# Patient Record
Sex: Male | Born: 1945 | Race: White | Hispanic: No | Marital: Married | Smoking: Never smoker
Health system: Southern US, Community
[De-identification: ages and names within clinical notes are randomized; demographics above are authoritative.]

## PROBLEM LIST (undated history)

## (undated) DIAGNOSIS — I10 Essential (primary) hypertension: Secondary | ICD-10-CM

---

## 2018-08-05 ENCOUNTER — Emergency Department (HOSPITAL_COMMUNITY): Payer: Medicare Other

## 2018-08-05 ENCOUNTER — Emergency Department (HOSPITAL_COMMUNITY)
Admission: EM | Admit: 2018-08-05 | Discharge: 2018-08-06 | Disposition: A | Discharge status: Home / Self Care | Payer: Medicare Other | Attending: Emergency Medicine | Admitting: Emergency Medicine

## 2018-08-05 ENCOUNTER — Other Ambulatory Visit: Payer: Self-pay

## 2018-08-05 ENCOUNTER — Encounter (HOSPITAL_COMMUNITY): Payer: Self-pay | Admitting: *Deleted

## 2018-08-05 DIAGNOSIS — Z79899 Other long term (current) drug therapy: Secondary | ICD-10-CM | POA: Insufficient documentation

## 2018-08-05 DIAGNOSIS — F13239 Sedative, hypnotic or anxiolytic dependence with withdrawal, unspecified: Secondary | ICD-10-CM | POA: Diagnosis not present

## 2018-08-05 DIAGNOSIS — I10 Essential (primary) hypertension: Secondary | ICD-10-CM | POA: Diagnosis not present

## 2018-08-05 DIAGNOSIS — F13939 Sedative, hypnotic or anxiolytic use, unspecified with withdrawal, unspecified: Secondary | ICD-10-CM

## 2018-08-05 DIAGNOSIS — R531 Weakness: Secondary | ICD-10-CM | POA: Diagnosis present

## 2018-08-05 HISTORY — DX: Essential (primary) hypertension: I10

## 2018-08-05 LAB — BASIC METABOLIC PANEL
Anion gap: 12 (ref 5–15)
BUN: 15 mg/dL (ref 8–23)
CALCIUM: 9.1 mg/dL (ref 8.9–10.3)
CO2: 23 mmol/L (ref 22–32)
CREATININE: 1.06 mg/dL (ref 0.61–1.24)
Chloride: 108 mmol/L (ref 98–111)
GFR calc Af Amer: >60 mL/min (ref >60)
GLUCOSE: 113 mg/dL — AB (ref 70–99)
Potassium: 3.7 mmol/L (ref 3.5–5.1)
Sodium: 143 mmol/L (ref 135–145)

## 2018-08-05 LAB — CBC
HCT: 47 % (ref 39.0–52.0)
HEMOGLOBIN: 16 g/dL (ref 13.0–17.0)
MCH: 30.4 pg (ref 26.0–34.0)
MCHC: 34 g/dL (ref 30.0–36.0)
MCV: 89.4 fL (ref 78.0–100.0)
PLATELETS: 153 10*3/uL (ref 150–400)
RBC: 5.26 MIL/uL (ref 4.22–5.81)
RDW: 12.5 % (ref 11.5–15.5)
WBC: 6.8 10*3/uL (ref 4.0–10.5)

## 2018-08-05 LAB — CBG MONITORING, ED: GLUCOSE-CAPILLARY: 111 mg/dL — AB (ref 70–99)

## 2018-08-05 LAB — TROPONIN I

## 2018-08-05 MED ORDER — ALPRAZOLAM 0.25 MG PO TABS
0.5000 mg | ORAL_TABLET | Freq: Once | ORAL | Status: AC
  Administered 2018-08-06: 0.5 mg via ORAL
  Filled 2018-08-05: qty 2

## 2018-08-05 MED ORDER — ATENOLOL 25 MG PO TABS
25.0000 mg | ORAL_TABLET | Freq: Once | ORAL | Status: AC
  Administered 2018-08-06: 25 mg via ORAL
  Filled 2018-08-05: qty 1

## 2018-08-05 MED ORDER — GABAPENTIN 300 MG PO CAPS: 300.0000 mg | ORAL_CAPSULE | Freq: Once | ORAL | Status: DC

## 2018-08-05 MED ORDER — GABAPENTIN 300 MG PO CAPS
600.0000 mg | ORAL_CAPSULE | Freq: Once | ORAL | Status: AC
  Administered 2018-08-06: 600 mg via ORAL
  Filled 2018-08-05: qty 2

## 2018-08-05 NOTE — ED Triage Notes (Signed)
The pt feels weird he left his meds in [place] and he goes back there tomorrow  He has had cold sweats  No pain

## 2018-08-05 NOTE — ED Notes (Signed)
Pt reports traveling from [place] and leaving all of his medications at home, not having any since Tuesday. Pt states he drove

## 2018-08-06 MED ORDER — GABAPENTIN 300 MG PO CAPS: 600.0000 mg | ORAL_CAPSULE | Freq: Two times a day (BID) | ORAL | 0 refills | Status: AC

## 2018-08-06 MED ORDER — ATENOLOL 25 MG PO TABS: 25.0000 mg | ORAL_TABLET | Freq: Every day | ORAL | 0 refills | Status: AC

## 2018-08-06 NOTE — ED Provider Notes (Signed)
[facility] EMERGENCY [facility] Provider Note   CSN: [id number] Arrival date & time: 08/05/18  2204     History   Chief Complaint Chief Complaint  Patient presents with  . Weakness    HPI Raymond Glover is a 72 y.o. male.  The history is provided by the patient.  Weakness  This is a new problem. The current episode started 1 to 2 hours ago. The problem has not changed since onset.There was no focality noted. There has been no fever. Pertinent negatives include no shortness of breath, no chest pain, no vomiting, no altered mental status, no confusion and no headaches. There were no medications administered prior to arrival.   Pt with History of hypertension anxiety presents with weakness and not feeling well. Patient is here in [place] on business, he lives in [place]. He left his medications at home.  He usually takes gabapentin 600 mg and Xanax 0.5 mg at nighttime.  He is also missing his daily dose of atenolol. Tonight after trying to go to sleep, he woke up feeling anxious, generalized weakness, he reports he "felt weird" he reports cold sweats. Denies headache/vision changes.  No chest pain or shortness of breath No focal weakness.  Past Medical History:  Diagnosis Date  . Hypertension     There are no active problems to display for this patient.   History reviewed. No pertinent surgical history.      Home Medications    Prior to Admission medications   Medication Sig Start Date End Date Taking? Authorizing Provider  ALPRAZolam Prudy Feeler(XANAX) 0.5 MG tablet Take 0.5 mg by mouth at bedtime.  07/19/18  Yes [provider]    Family History No family history on file.  Social History Social History   Tobacco Use  . Smoking status: Never Smoker  . Smokeless tobacco: Never Used  Substance Use Topics  . Alcohol use: Yes  . Drug use: Not on file     Allergies   Nitroglycerin; Percocet [oxycodone-acetaminophen]; and  Sulfa antibiotics   Review of Systems Review of Systems  Constitutional: Positive for chills and fatigue. Negative for fever.  Respiratory: Negative for shortness of breath.   Cardiovascular: Negative for chest pain.  Gastrointestinal: Negative for vomiting.  Neurological: Positive for weakness. Negative for syncope and headaches.  Psychiatric/Behavioral: Negative for confusion.  All other systems reviewed and are negative.    Physical Exam Updated Vital Signs BP (!) 159/117   Pulse 74   Temp 97.9 F (36.6 C)   Resp 20   Ht 1.854 m (6\' 1" )   Wt 97.1 kg   SpO2 98%   BMI 28.23 kg/m   Physical Exam CONSTITUTIONAL: Well developed/well nourished HEAD: Normocephalic/atraumatic EYES: EOMI/PERRL, no nystagmus, no ptosis ENMT: Mucous membranes moist NECK: supple no meningeal signs, no bruits CV: S1/S2 noted, no murmurs/rubs/gallops noted LUNGS: Lungs are clear to auscultation bilaterally, no apparent distress ABDOMEN: soft, nontender, no rebound or guarding GU:no cva tenderness NEURO:Awake/alert, face symmetric, no arm or leg drift is noted Equal 5/5 strength with shoulder abduction, elbow flex/extension, wrist flex/extension in upper extremities and equal hand grips bilaterally Equal 5/5 strength with hip flexion,knee flex/extension, foot dorsi/plantar flexion Cranial nerves 3/4/5/6/06/30/09/11/12 tested and intact Gait normal without ataxia No past pointing Sensation to light touch intact in all extremities EXTREMITIES: pulses normal, full ROM SKIN: warm, color normal PSYCH: no abnormalities of mood noted   ED Treatments / Results  Labs (all labs ordered are listed, but only  abnormal results are displayed) Labs Reviewed  BASIC METABOLIC PANEL - Abnormal; Notable for the following components:      Result Value   Glucose, Bld 113 (*)    All other components within normal limits  CBG MONITORING, ED - Abnormal; Notable for the following components:   Glucose-Capillary  111 (*)    All other components within normal limits  CBC  TROPONIN I    EKG EKG Interpretation  Date/Time:  Wednesday August 05 2018 23:39:29 EDT Ventricular Rate:  69 PR Interval:  168 QRS Duration: 131 QT Interval:  426 QTC Calculation: 457 R Axis:   63 Text Interpretation:  Sinus rhythm Right bundle branch block Confirmed by Zadie RhineWickline, Jaggar Benko ([id number]) on 08/05/2018 11:53:54 PM   Radiology Dg Chest 2 View  Result Date: 08/05/2018 CLINICAL DATA:  Elevated blood pressure EXAM: CHEST - 2 VIEW COMPARISON:  None. FINDINGS: The heart size and mediastinal contours are within normal limits. Both lungs are clear. Degenerative osteophytes of the spine. IMPRESSION: No active cardiopulmonary disease. Electronically Signed   By: Jasmine PangKim  Fujinaga M.D.   On: 08/05/2018 22:44    Procedures Procedures  Medications Ordered in ED Medications  ALPRAZolam Prudy Feeler(XANAX) tablet 0.5 mg (0.5 mg Oral Given 08/06/18 0020)  atenolol (TENORMIN) tablet 25 mg (25 mg Oral Given 08/06/18 0020)  gabapentin (NEURONTIN) capsule 600 mg (600 mg Oral Given 08/06/18 0021)     Initial Impression / Assessment and Plan / ED Course  I have reviewed the triage vital signs and the nursing notes.  Pertinent labs & imaging results that were available during my care of the patient were reviewed by me and considered in my medical decision making (see chart for details). Narcotic database reviewed and considered in decision making     12:11 AM I have a strong suspicion that patient is undergoing withdrawal from lack of medications.  Specifically he is likely experiencing withdrawal from his chronic Xanax use, as well as abrupt withdrawal of gabapentin.  He may also feeling the effects of elevated blood pressure which he is not used to His exam is unremarkable.  He does report a history of a known bundle branch block, therefore his EKG is likely unchanged. Plan to give his home medications here, will give a short supply prior to  discharge as [place]  1:51 AM Patient feeling improved, vitals improved. He feels well for discharge.  He will be back home tomorrow night when he can restart his Xanax.  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

## 2018-08-06 NOTE — Discharge Instructions (Addendum)

## 2019-12-28 IMAGING — DX DG CHEST 2V
2 series · 2 of 2 positions shown · non-contrast
Comparison: None.

CLINICAL DATA: Elevated blood pressure

EXAM:
CHEST - 2 VIEW

[chest lat]
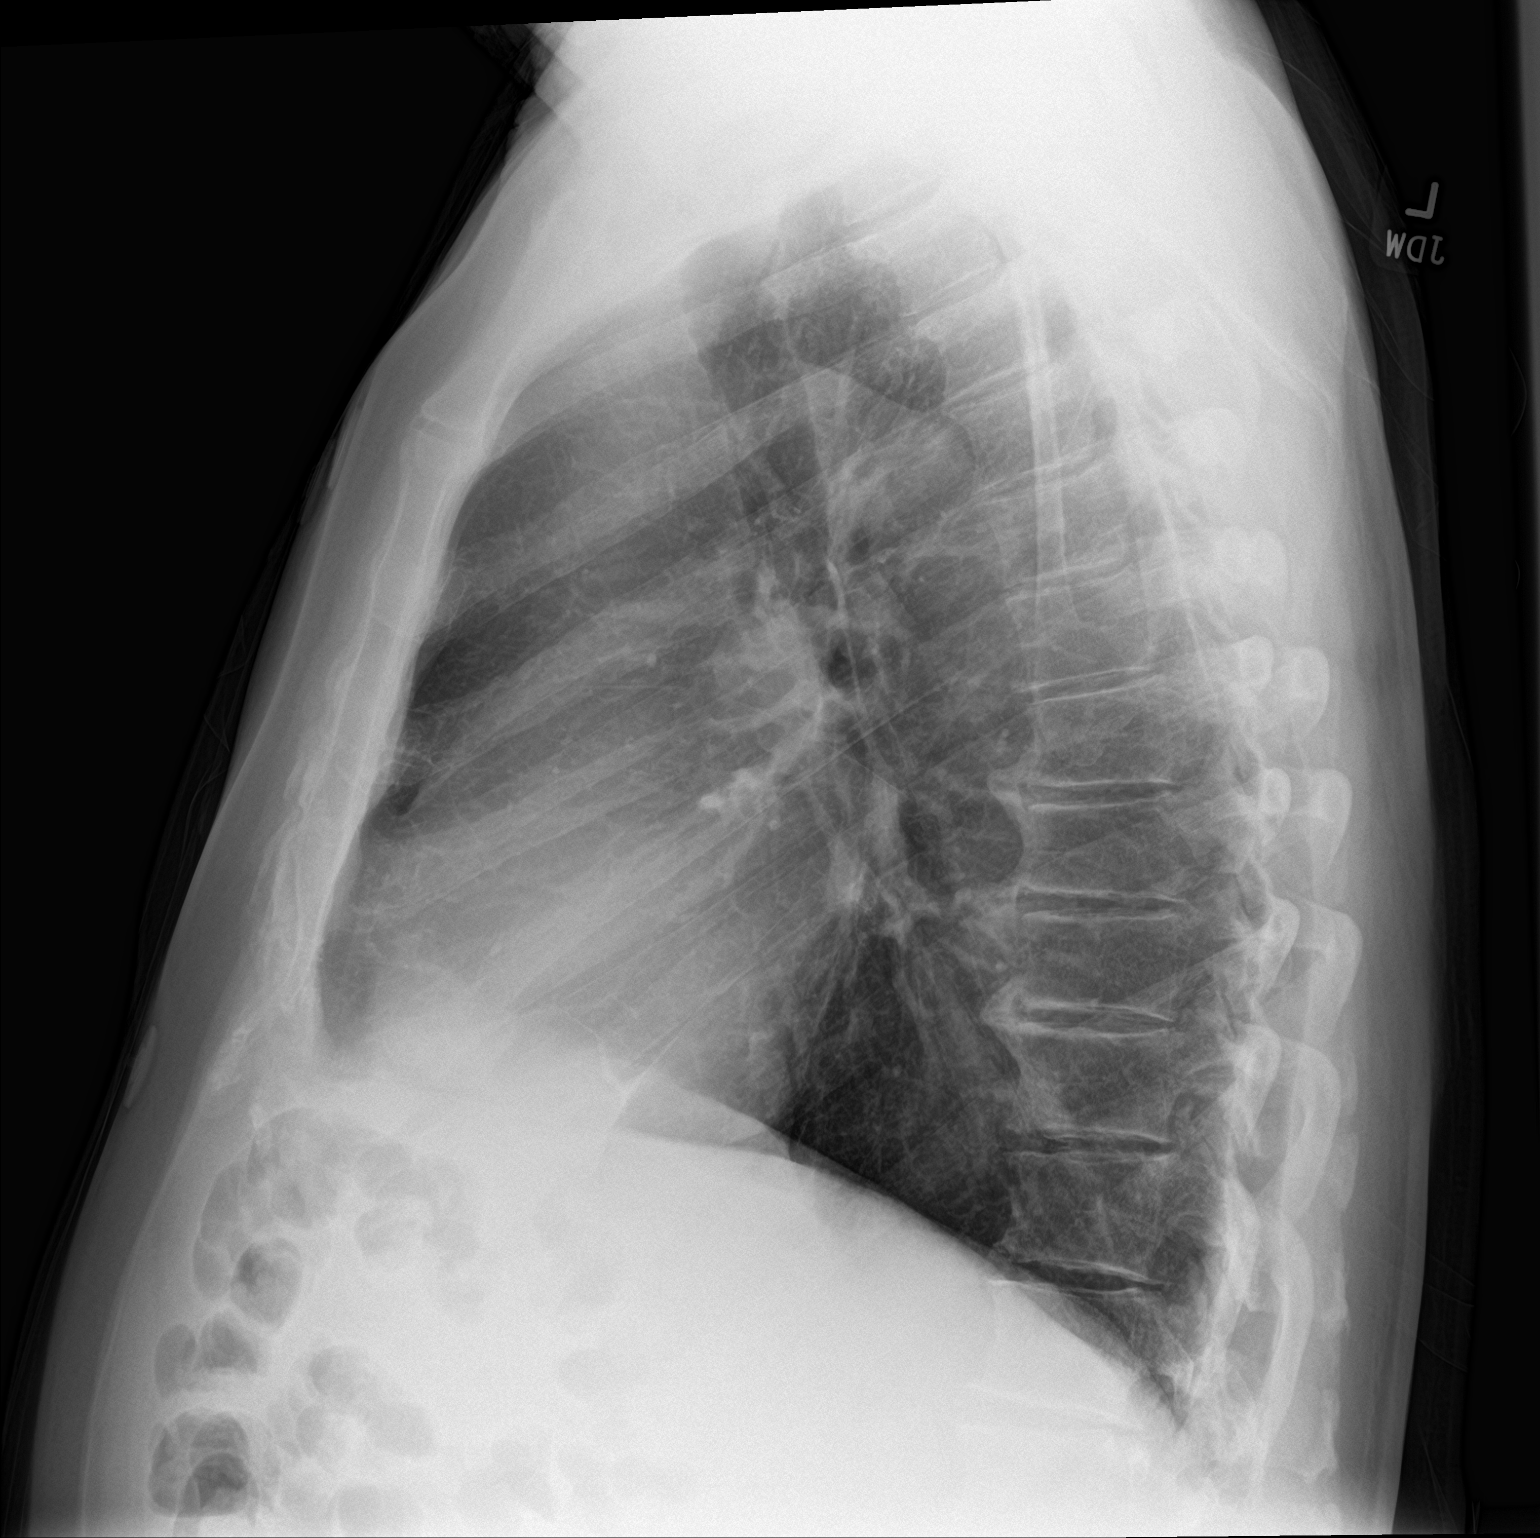

[chest pa]
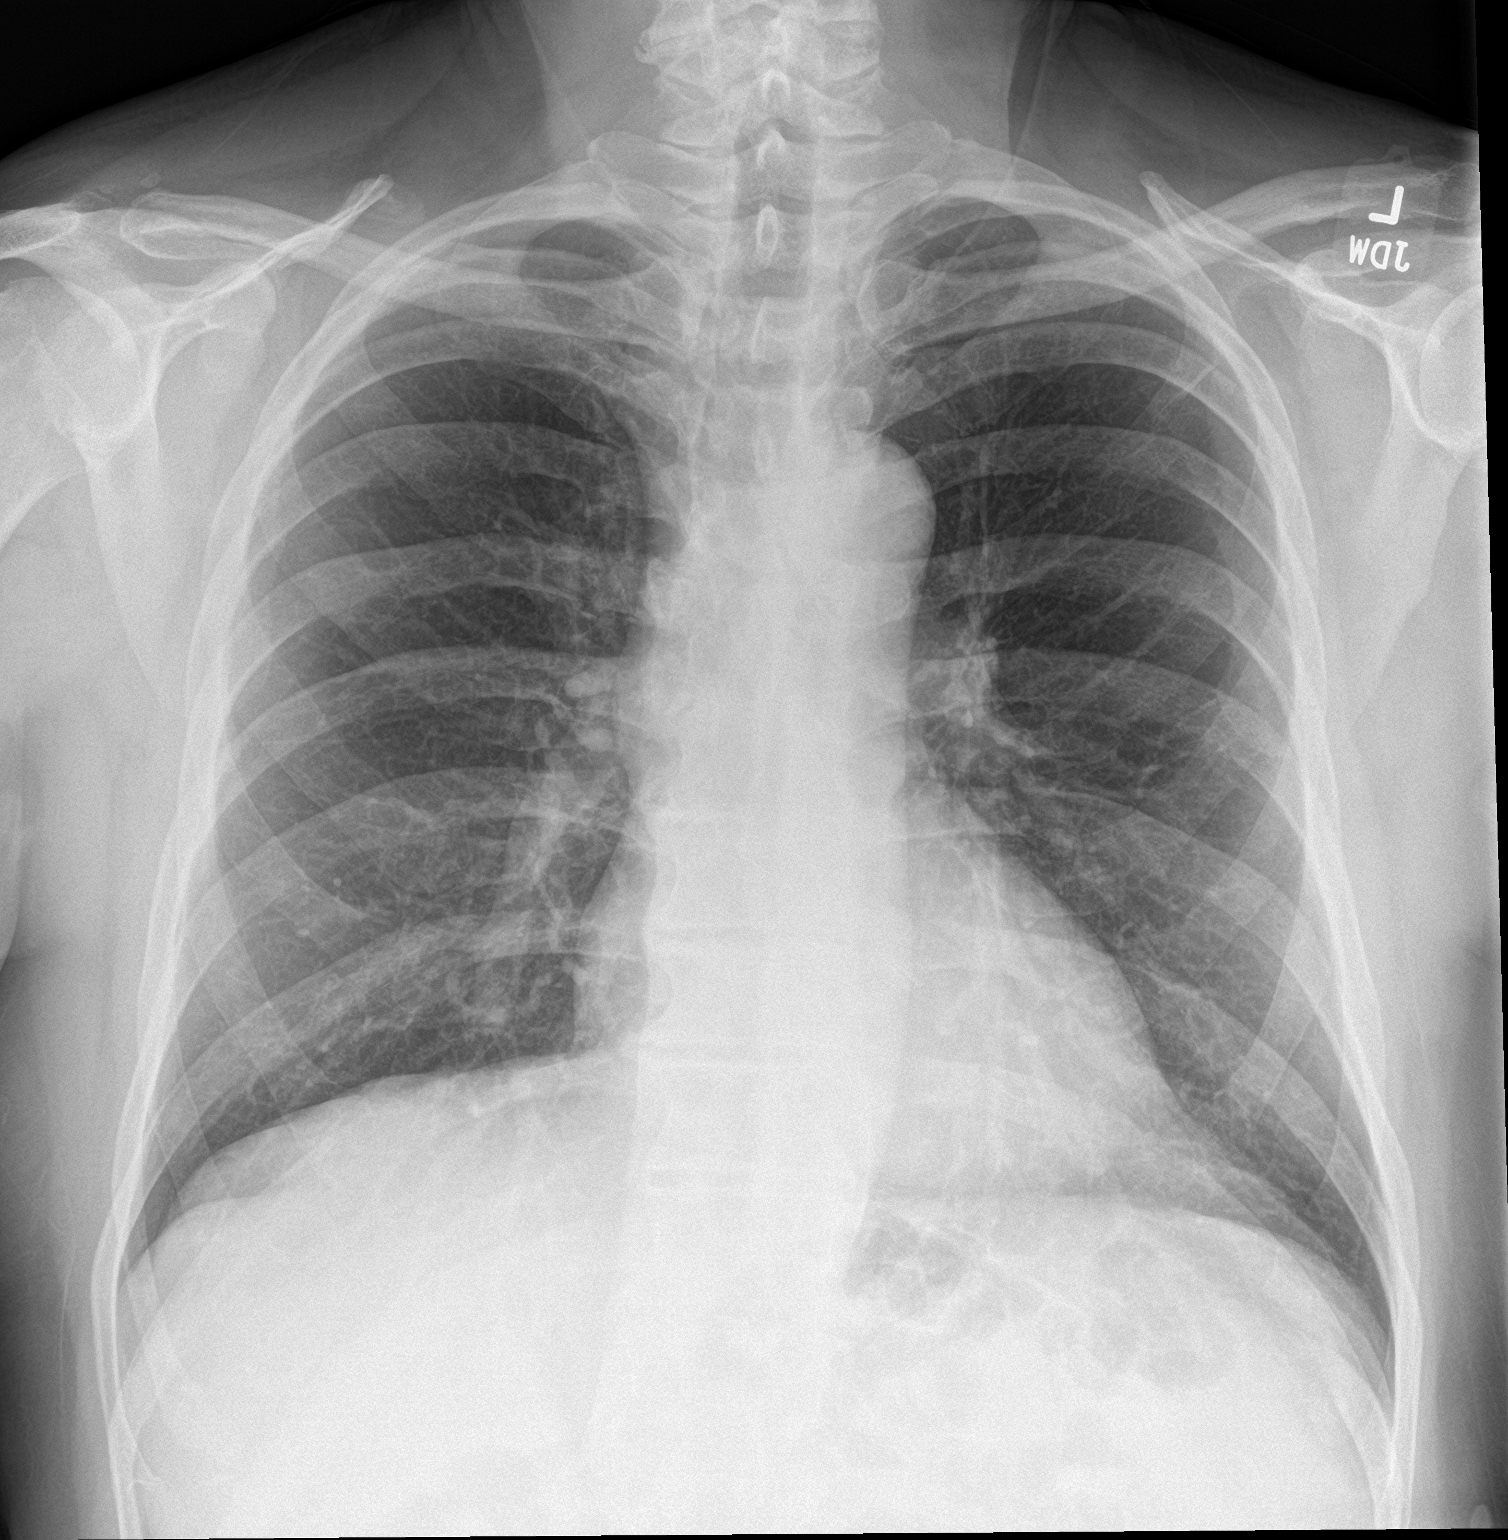

[2 of 2 positions shown; findings below may reference images not displayed]

FINDINGS: The heart size and mediastinal contours are within normal limits.
Both lungs are clear. Degenerative osteophytes of the spine.
IMPRESSION: No active cardiopulmonary disease.
